# Patient Record
Sex: Male | Born: 2000 | Race: White | Hispanic: No | Marital: Single | Smoking: Never smoker
Health system: Southern US, Community
[De-identification: ages and names within clinical notes are randomized; demographics above are authoritative.]

## PROBLEM LIST (undated history)

## (undated) DIAGNOSIS — F988 Other specified behavioral and emotional disorders with onset usually occurring in childhood and adolescence: Secondary | ICD-10-CM

## (undated) DIAGNOSIS — F419 Anxiety disorder, unspecified: Secondary | ICD-10-CM

## (undated) DIAGNOSIS — R12 Heartburn: Secondary | ICD-10-CM

## (undated) HISTORY — PX: OTHER SURGICAL HISTORY: SHX169

## (undated) HISTORY — PX: UPPER GASTROINTESTINAL ENDOSCOPY: SHX188

---
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
## 2019-07-02 ENCOUNTER — Other Ambulatory Visit: Payer: Self-pay

## 2019-07-02 DIAGNOSIS — Z20822 Contact with and (suspected) exposure to covid-19: Secondary | ICD-10-CM

## 2019-07-04 LAB — NOVEL CORONAVIRUS, NAA: SARS-CoV-2, NAA: NOT DETECTED

## 2019-09-15 IMAGING — US US SCROTUM
1 series · 14 of 25 positions shown · non-contrast
Comparison: None

CLINICAL DATA: LEFT testicular pain for 1 day

EXAM:
SCROTAL ULTRASOUND
DOPPLER ULTRASOUND OF THE TESTICLES
TECHNIQUE: Complete ultrasound examination of the testicles, epididymis, and
other scrotal structures was performed. Color and spectral Doppler
ultrasound were also utilized to evaluate blood flow to the
testicles.

[Series 1: us scrotum · 0.08mm/px · 14 of 49 slices shown]
[im 1/49]
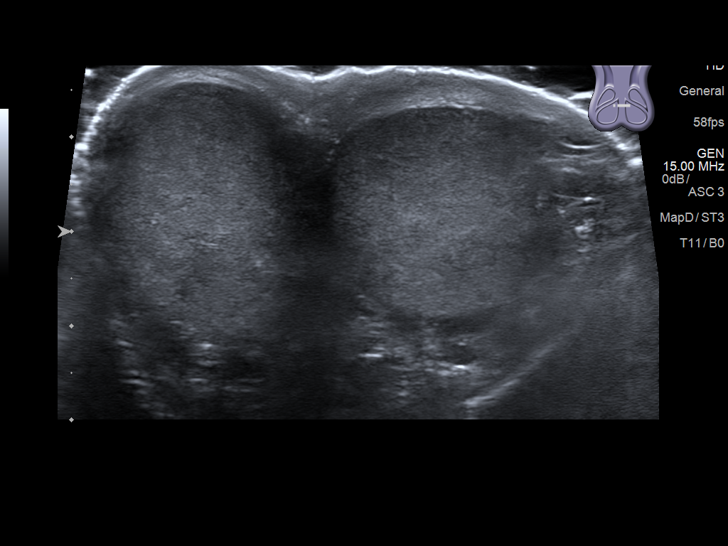
[im 5/49]
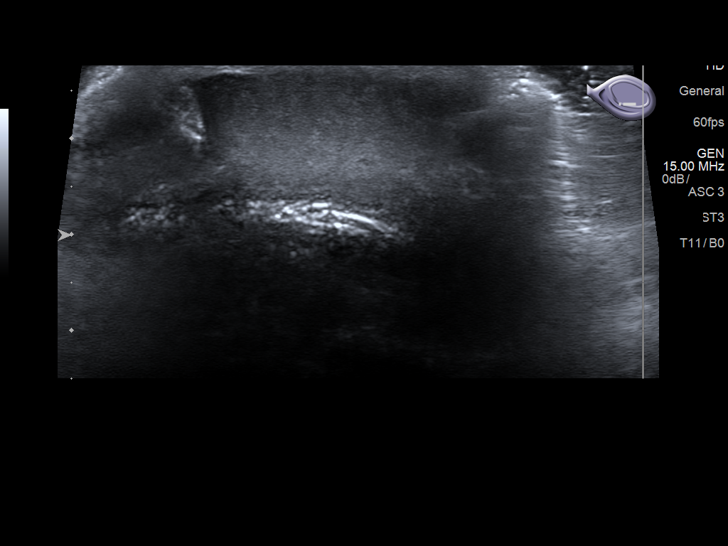
[im 9/49]
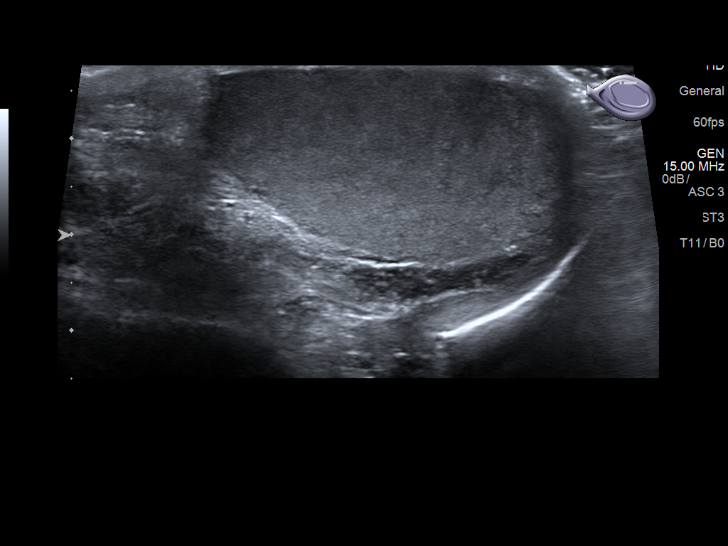
[im 13/49]
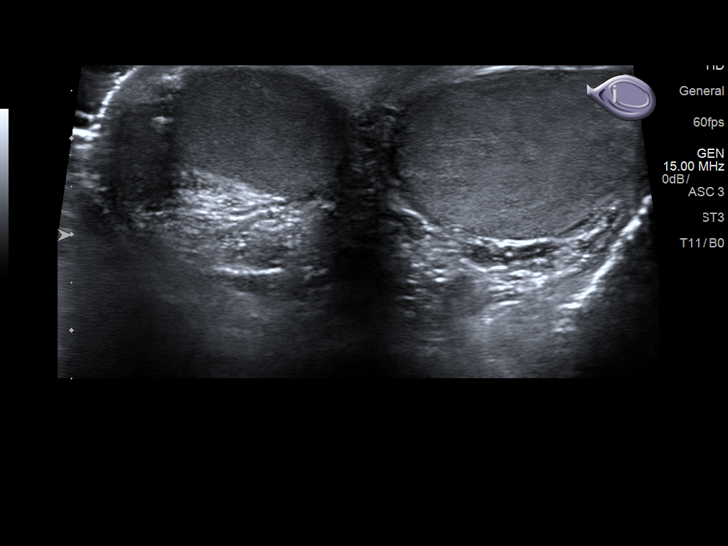
[im 17/49]
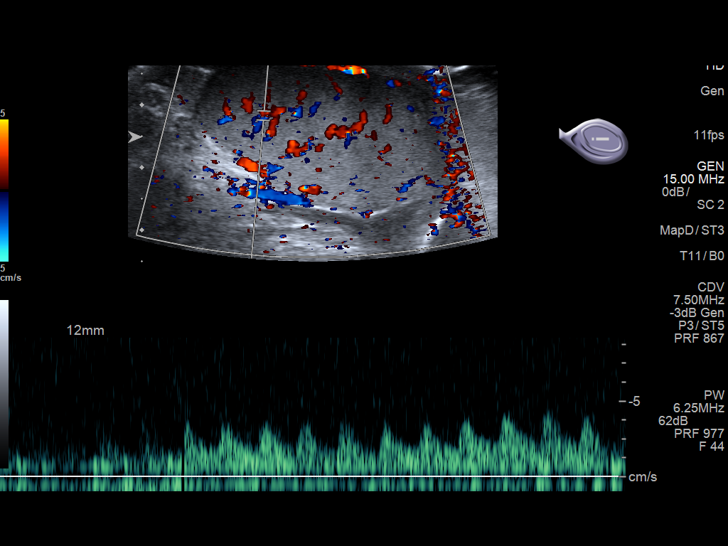
[im 19/49]
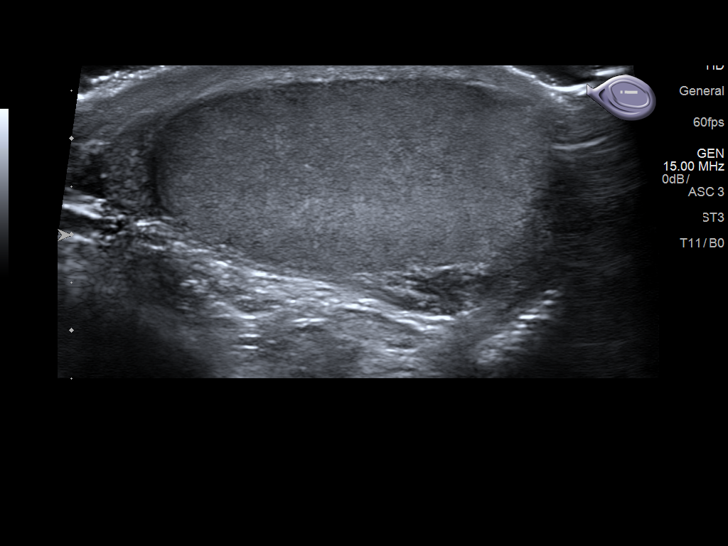
[im 23/49]
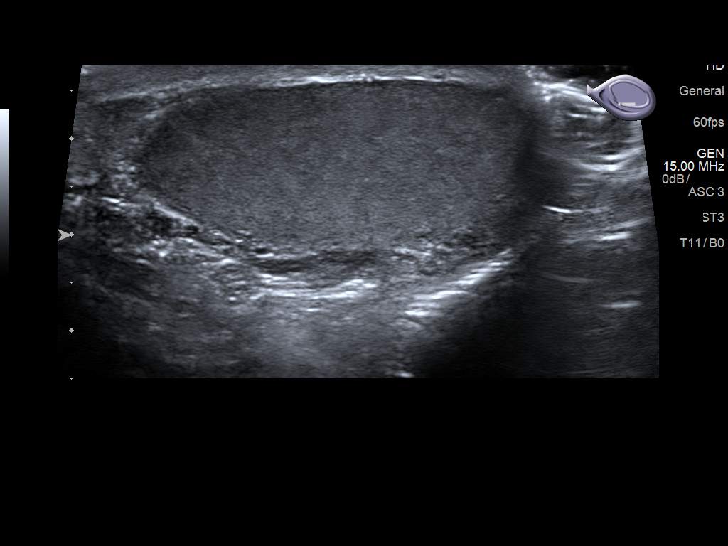
[im 27/49]
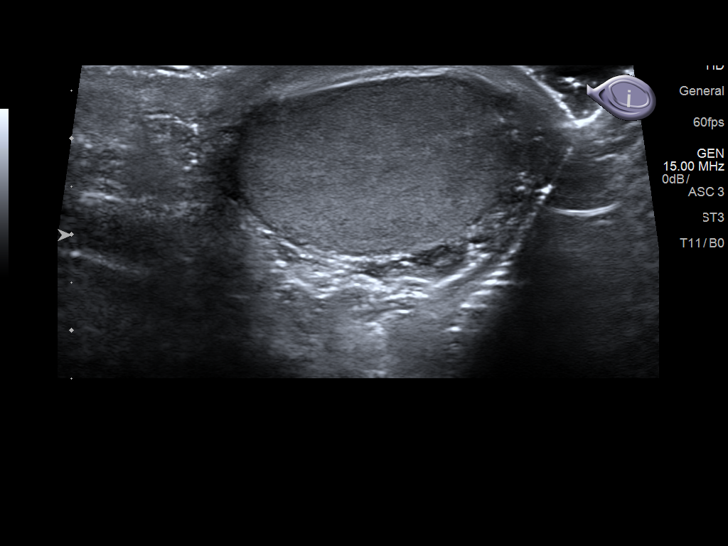
[im 31/49]
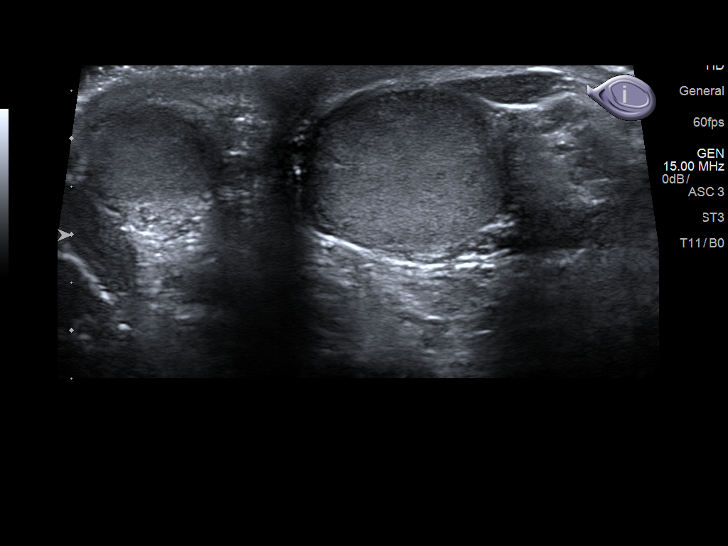
[im 33/49]
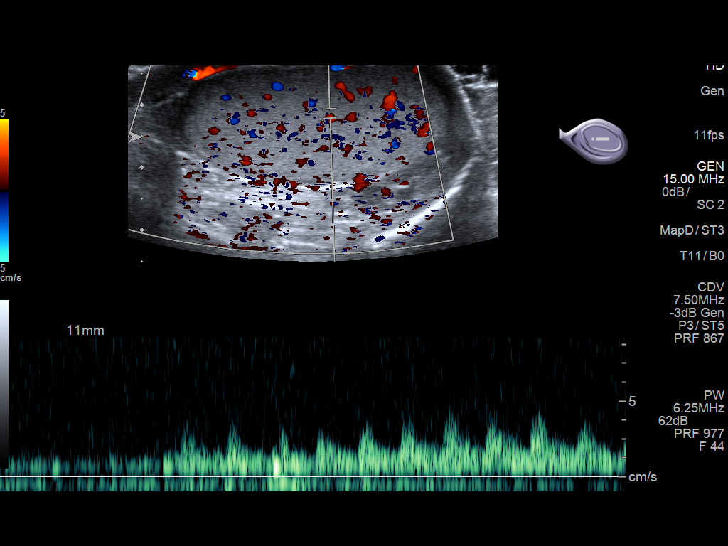
[im 37/49]
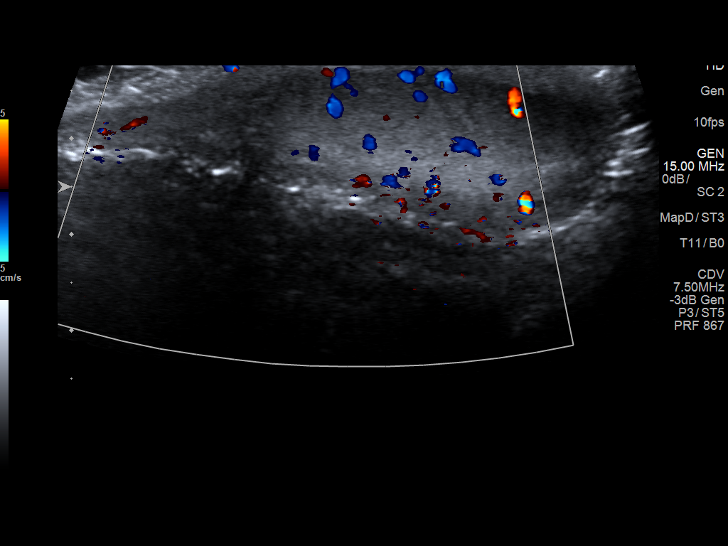
[im 41/49]
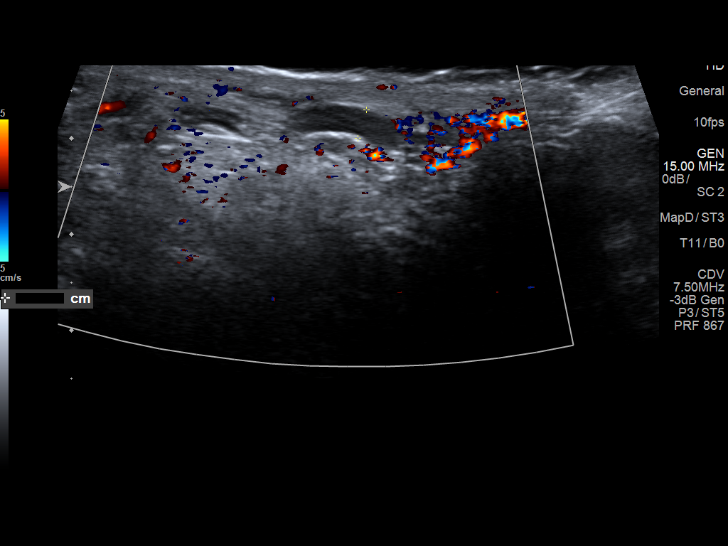
[im 45/49]
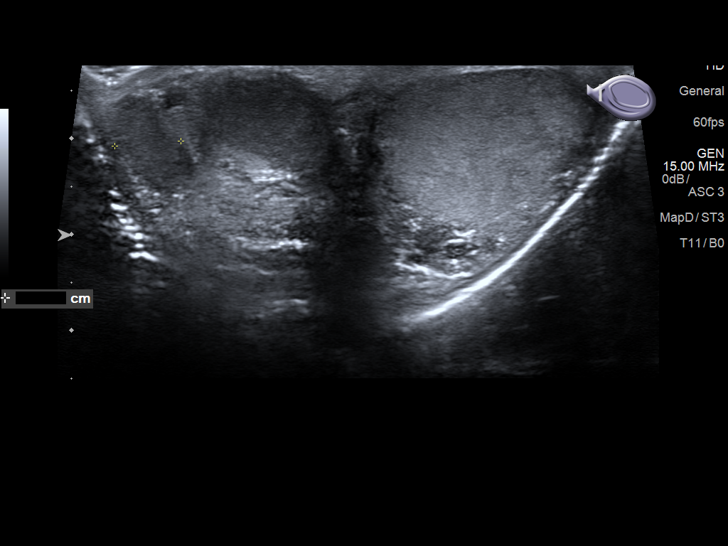
[im 49/49]
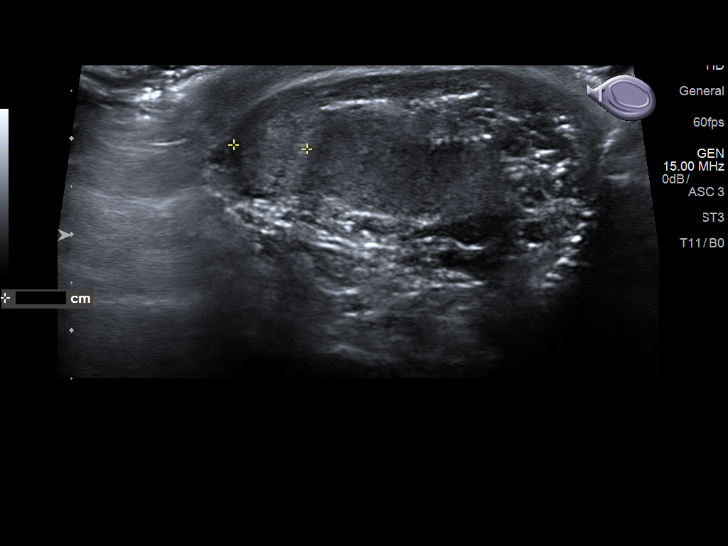

[14 of 25 positions shown; findings below may reference images not displayed]

FINDINGS: Right testicle

Measurements: 4.1 x 2.0 x 2.2 cm. Normal morphology without mass or
calcification. Internal blood flow present on color Doppler imaging.

Left testicle

Measurements: 4.1 x 2.0 x 2.8 cm. Normal morphology without mass or
calcification. Internal blood flow present on color Doppler imaging.

Right epididymis:  Normal in size and appearance.

Left epididymis:  Normal in size and appearance.

Hydrocele:  Absent bilaterally

Varicocele:  LEFT varicocele present

Pulsed Doppler interrogation of both testes demonstrates normal low
resistance arterial and venous waveforms bilaterally.
IMPRESSION: Normal appearance of the testes and epididymis bilaterally without
evidence of testicular mass, torsion, or inflammatory process.

LEFT varicocele.

## 2019-12-04 ENCOUNTER — Ambulatory Visit: Payer: Self-pay | Attending: Family

## 2019-12-04 DIAGNOSIS — Z23 Encounter for immunization: Secondary | ICD-10-CM

## 2019-12-04 NOTE — Progress Notes (Signed)
   Covid-19 Vaccination Clinic  Name:  Shawn Alvarado    MRN: [id number] DOB: 11-21-2000  12/04/2019  [name] was observed post Covid-19 immunization for 15 minutes without incident. He was provided with Vaccine Information Sheet and instruction to access the V-Safe system.   [name] was instructed to call 911 with any severe reactions post vaccine: Marland Kitchen Difficulty breathing  . Swelling of face and throat  . A fast heartbeat  . A bad rash all over body  . Dizziness and weakness   Immunizations Administered    Name Date Dose VIS Date Route   Moderna COVID-19 Vaccine 12/04/2019 12:54 PM 0.5 mL 08/12/2019 Intramuscular   Manufacturer: Moderna   Lot: [id number]   NDC: 73578-978-47

## 2020-01-06 ENCOUNTER — Ambulatory Visit: Payer: Self-pay

## 2020-01-13 ENCOUNTER — Ambulatory Visit: Payer: Self-pay | Attending: Family

## 2020-01-13 DIAGNOSIS — Z23 Encounter for immunization: Secondary | ICD-10-CM

## 2020-01-13 NOTE — Progress Notes (Signed)
   Covid-19 Vaccination Clinic  Name:  Shawn Alvarado    MRN: [id number] DOB: 09-21-2000  01/13/2020  [name] was observed post Covid-19 immunization for 15 minutes without incident. He was provided with Vaccine Information Sheet and instruction to access the V-Safe system.   [name] was instructed to call 911 with any severe reactions post vaccine: Marland Kitchen Difficulty breathing  . Swelling of face and throat  . A fast heartbeat  . A bad rash all over body  . Dizziness and weakness   Immunizations Administered    Name Date Dose VIS Date Route   Moderna COVID-19 Vaccine 01/13/2020  1:49 PM 0.5 mL 08/2019 Intramuscular   Manufacturer: Moderna   Lot: [id number]   NDC: 43329-518-84
